# Patient Record
Sex: Male | Born: 2000 | Race: Black or African American | Hispanic: No | Marital: Single
Health system: Southern US, Community
[De-identification: ages and names within clinical notes are randomized; demographics above are authoritative.]

---
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
## 2022-08-01 ENCOUNTER — Other Ambulatory Visit: Payer: Self-pay

## 2022-08-01 ENCOUNTER — Encounter (HOSPITAL_COMMUNITY): Payer: Self-pay

## 2022-08-01 ENCOUNTER — Emergency Department (HOSPITAL_COMMUNITY)
Admission: EM | Admit: 2022-08-01 | Discharge: 2022-08-01 | Disposition: A | Discharge status: Home / Self Care | Payer: Medicaid Other | Attending: Emergency Medicine | Admitting: Emergency Medicine

## 2022-08-01 DIAGNOSIS — M542 Cervicalgia: Secondary | ICD-10-CM | POA: Insufficient documentation

## 2022-08-01 DIAGNOSIS — Y92410 Unspecified street and highway as the place of occurrence of the external cause: Secondary | ICD-10-CM | POA: Insufficient documentation
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
